# Patient Record
Sex: Female | Born: 1950 | Race: White | Hispanic: No | Marital: Married | Smoking: Never smoker
Health system: Southern US, Community
[De-identification: ages and names within clinical notes are randomized; demographics above are authoritative.]

## PROBLEM LIST (undated history)

## (undated) DIAGNOSIS — C801 Malignant (primary) neoplasm, unspecified: Secondary | ICD-10-CM

## (undated) DIAGNOSIS — M1990 Unspecified osteoarthritis, unspecified site: Secondary | ICD-10-CM

## (undated) DIAGNOSIS — H269 Unspecified cataract: Secondary | ICD-10-CM

## (undated) DIAGNOSIS — D649 Anemia, unspecified: Secondary | ICD-10-CM

## (undated) DIAGNOSIS — T7840XA Allergy, unspecified, initial encounter: Secondary | ICD-10-CM

## (undated) HISTORY — DX: Anemia, unspecified: D64.9

## (undated) HISTORY — DX: Unspecified osteoarthritis, unspecified site: M19.90

## (undated) HISTORY — PX: NM RENAL LASIX (ARMC HX): HXRAD1213

## (undated) HISTORY — PX: COLONOSCOPY: SHX174

## (undated) HISTORY — DX: Allergy, unspecified, initial encounter: T78.40XA

## (undated) HISTORY — DX: Unspecified cataract: H26.9

## (undated) HISTORY — PX: HEMORRHOID SURGERY: SHX153

## (undated) HISTORY — DX: Malignant (primary) neoplasm, unspecified: C80.1

## (undated) HISTORY — PX: OTHER SURGICAL HISTORY: SHX169

---

## 1994-03-21 HISTORY — PX: ABDOMINAL HYSTERECTOMY: SHX81

## 2014-04-09 ENCOUNTER — Encounter: Payer: Self-pay | Admitting: Podiatrist

## 2014-04-09 ENCOUNTER — Ambulatory Visit (INDEPENDENT_AMBULATORY_CARE_PROVIDER_SITE_OTHER): Payer: BLUE CROSS/BLUE SHIELD | Admitting: Podiatrist

## 2014-04-09 VITALS — BP 101/67 | HR 66 | Resp 12

## 2014-04-09 DIAGNOSIS — M6740 Ganglion, unspecified site: Secondary | ICD-10-CM

## 2014-04-09 NOTE — Progress Notes (Signed)
   Subjective:    Patient ID: Nicole Medina, female    DOB: 07-Jan-1951, 64 y.o.   MRN: [id number]  HPI PT STATED RT FOOT HAVE A KNOT AND THE TOES AND ARCH BEEN PAINFUL FOR 6 MONTHS. THE FOOT IS GETTING WORSE AND ESPECIALLY FIRST THING IN THE MORNING. TRIED NO TREATMENT.   Review of Systems  All other systems reviewed and are negative.      Objective:   Physical Exam Patient is awake, alert, and oriented x 3.  In no acute distress.  Vascular status is intact with palpable pedal pulses at 2/4 DP and PT bilateral and capillary refill time within normal limits. Neurological sensation is also intact bilaterally via Semmes Weinstein monofilament at 5/5 sites. Light touch, vibratory sensation, Achilles tendon reflex is intact. Dermatological exam reveals skin color, turger and texture as normal. No open lesions present.  Musculature intact with dorsiflexion, plantarflexion, inversion, eversion.  Soft fluctuance on the dorsum of the foot at the medial/intermediate cuneiform/navicular is noted with compression.  It does appear to be a ganglion based on its presentation in this area. Mild tenderness with palpation identified.    xrays taken and reveal slight soft tissue swelling dorsally but no discreet fluid collection is identified on xray.      Assessment & Plan:  Ganglion cyst- dorsum of left foot (slowly growing)-- painful  Plan:  Recommended injection of steroid into the cyst as it is not tense or well identified enough to drain.  Patient agreed and this was carried out today under sterile technique with 5 mg of kenalog, 2 mg of dexamethasone and 0.5% marcaine plain.  Dr. Genene Churn tolerated this well.  We will watch the area for the next 2 weeks and if there is no improvement, will consder an mri for delineation of the cyst and for possible surgical excision.

## 2014-04-09 NOTE — Patient Instructions (Signed)

## 2014-09-03 ENCOUNTER — Ambulatory Visit: Payer: BLUE CROSS/BLUE SHIELD | Admitting: Podiatry

## 2014-09-24 ENCOUNTER — Ambulatory Visit (INDEPENDENT_AMBULATORY_CARE_PROVIDER_SITE_OTHER): Payer: BLUE CROSS/BLUE SHIELD

## 2014-09-24 ENCOUNTER — Ambulatory Visit (INDEPENDENT_AMBULATORY_CARE_PROVIDER_SITE_OTHER): Payer: BLUE CROSS/BLUE SHIELD | Admitting: Podiatry

## 2014-09-24 ENCOUNTER — Encounter: Payer: Self-pay | Admitting: Podiatry

## 2014-09-24 VITALS — BP 110/69 | HR 59 | Resp 12

## 2014-09-24 DIAGNOSIS — M6740 Ganglion, unspecified site: Secondary | ICD-10-CM

## 2014-09-24 NOTE — Progress Notes (Signed)
   Subjective:    Patient ID: Nicole Medina, female    DOB: 01-21-1951, 64 y.o.   MRN: [id number]  HPI  This patient presents today complaining of pain in the dorsal left midfoot with increasing pain the last 4 weeks. The symptoms occur on weightbearing and physical activity such as treadmill and are relieved with rest. She has less discomfort when she wears in athletic style shoe. She has approximately 13 year history of pain in the dorsal left foot with generalized increasing pain in the past year. She describes having a Kenalog dexamethasone injection by Dr. Kevan Ny  January 2016 with 3 weeks of relief from the injection. She describes a soft tissue mass on the dorsum of the left foot, however, she says that the swelling has reduced but still persisted. Discomfort in the dorsal acid left foot reduces some of her ability to do her physical activity. Patient also describes treatment by Dr. Blanch Media a podiatrist who recommended observation of the area dorsal aspect left foot. She also relates a history of having custom foot orthotics that she has, however, has not worn recently. She describes the orthotics as rigid She denies any other joint pain     Review of Systems  All other systems reviewed and are negative.      Objective:   Physical Exam  Orientated 3  Vascular: No peripheral edema noted bilaterally DP and PT pulses 2/4 bilaterally Capillary reflex immediate bilaterally  Dermatological: Texture and turgor. Good  Neurological: Ankle reflexes reactive bilaterally  Musculoskeletal:  palpation the dorsal left midfoot causes discomfort in or around the base of the first metatarsocuneiform area without any obvious discrete palpable lesions. There may be a slight edema over this area, however, there is no history no obvious organized soft tissue masses in the area. HAV deformity left Patient appears to have a stable gait without limping     X-ray weightbearing left foot with  metallic circular wire placed on the dorsal midfoot marking the symptomatic area  Intact bony structure without fracture and/or dislocation HAV deformity Inferior calcaneal spur The circular metallic wire overlies the base of first second metatarsal cuneiform area without any bony abnormalities noted There appears to be no increased soft tissue density underlying the metallic marker Bone density appears adequate Joint spaces appear adequate  Radiographic impression: No acute bony abnormality noted in the left foot          Assessment & Plan:   Assessment: History a ganglionic cyst dorsal aspect of left foot Possible chronic midfoot pain associated with weightbearing and overactivity Possible stress fracture  Plan: I reviewed the results of examination and x-ray with patient today. I made aware that I could not give her her a definitive diagnosis at this time. I recommended that she wear her existing rigid foot orthotics to see if the pain in her dorsal midfoot reduced with additional support. If the pain did not reduce or change with extra-support I recommended that she return for follow-up examination with Dr. Earleen Newport . I also made aware that if she return more definitive imaging such as MRI would be indicated  Reappoint at patient's request

## 2014-09-24 NOTE — Patient Instructions (Signed)
Where your existing rigid foot orthotic to see if your left midfoot pain reduces. If the midfoot pain left persists without improvement contact our office to schedule point with Dr. Mayo Ao for further evaluation and possible MRI imaging versus CT scanning

## 2018-12-07 ENCOUNTER — Ambulatory Visit (INDEPENDENT_AMBULATORY_CARE_PROVIDER_SITE_OTHER): Payer: Medicare Other

## 2018-12-07 ENCOUNTER — Ambulatory Visit (INDEPENDENT_AMBULATORY_CARE_PROVIDER_SITE_OTHER): Payer: Medicare Other | Admitting: Podiatry

## 2018-12-07 ENCOUNTER — Other Ambulatory Visit: Payer: Self-pay

## 2018-12-07 ENCOUNTER — Encounter: Payer: Self-pay | Admitting: Podiatry

## 2018-12-07 VITALS — BP 119/72 | HR 57 | Resp 16

## 2018-12-07 DIAGNOSIS — M67472 Ganglion, left ankle and foot: Secondary | ICD-10-CM

## 2018-12-07 DIAGNOSIS — M779 Enthesopathy, unspecified: Secondary | ICD-10-CM | POA: Diagnosis not present

## 2018-12-07 DIAGNOSIS — M792 Neuralgia and neuritis, unspecified: Secondary | ICD-10-CM

## 2018-12-07 NOTE — Patient Instructions (Signed)
You can apply voltaren gel to the area daily   Ganglion Cyst  A ganglion cyst is a non-cancerous, fluid-filled lump that occurs near a joint or tendon. The cyst grows out of a joint or the lining of a tendon. Ganglion cysts most often develop in the hand or wrist, but they can also develop in the shoulder, elbow, hip, knee, ankle, or foot. Ganglion cysts are ball-shaped or egg-shaped. Their size can range from the size of a pea to larger than a grape. Increased activity may cause the cyst to get bigger because more fluid starts to build up. What are the causes? The exact cause of this condition is not known, but it may be related to:  Inflammation or irritation around the joint.  An injury.  Repetitive movements or overuse.  Arthritis. What increases the risk? You are more likely to develop this condition if:  You are a woman.  You are 68-21 years old. What are the signs or symptoms? The main symptom of this condition is a lump. It most often appears on the hand or wrist. In many cases, there are no other symptoms, but a cyst can sometimes cause:  Tingling.  Pain.  Numbness.  Muscle weakness.  Weak grip.  Less range of motion in a joint. How is this diagnosed? Ganglion cysts are usually diagnosed based on a physical exam. Your health care provider will feel the lump and may shine a light next to it. If it is a ganglion cyst, the light will likely shine through it. Your health care provider may order an X-ray, ultrasound, or MRI to rule out other conditions. How is this treated? Ganglion cysts often go away on their own without treatment. If you have pain or other symptoms, treatment may be needed. Treatment is also needed if the ganglion cyst limits your movement or if it gets infected. Treatment may include:  Wearing a brace or splint on your wrist or finger.  Taking anti-inflammatory medicine.  Having fluid drained from the lump with a needle (aspiration).  Getting  a steroid injected into the joint.  Having surgery to remove the ganglion cyst.  Placing a pad on your shoe or wearing shoes that will not rub against the cyst if it is on your foot. Follow these instructions at home:  Do not press on the ganglion cyst, poke it with a needle, or hit it.  Take over-the-counter and prescription medicines only as told by your health care provider.  If you have a brace or splint: ? Wear it as told by your health care provider. ? Remove it as told by your health care provider. Ask if you need to remove it when you take a shower or a bath.  Watch your ganglion cyst for any changes.  Keep all follow-up visits as told by your health care provider. This is important. Contact a health care provider if:  Your ganglion cyst becomes larger or more painful.  You have pus coming from the lump.  You have weakness or numbness in the affected area.  You have a fever or chills. Get help right away if:  You have a fever and have any of these in the cyst area: ? Increased redness. ? Red streaks. ? Swelling. Summary  A ganglion cyst is a non-cancerous, fluid-filled lump that occurs near a joint or tendon.  Ganglion cysts most often develop in the hand or wrist, but they can also develop in the shoulder, elbow, hip, knee, ankle, or foot.  Ganglion cysts often go away on their own without treatment. This information is not intended to replace advice given to you by your health care provider. Make sure you discuss any questions you have with your health care provider. Document Released: 03/04/2000 Document Revised: 02/17/2017 Document Reviewed: 11/04/2016 Elsevier Patient Education  2020 [employer].

## 2018-12-19 NOTE — Progress Notes (Signed)
Subjective:   Patient ID: Nicole Medina, female   DOB: 68 y.o.   MRN: [id number]   HPI 68 year old female presents the office with concerns of dorsal midfoot pain, cyst.  She had this previously thousand 16 and she was referred to me at that point but she was doing well so she did not come and however she states that this started to recur and is actually become larger.  Area is painful with pressure in shoes over the last several months she started to get numbness in the second and third toes as well.  No recent injury.   Review of Systems  All other systems reviewed and are negative.  No past medical history on file.  No past surgical history on file.   Current Outpatient Medications:  .  CLIMARA 0.06 MG/24HR, See admin instructions., Disp: , Rfl: 12  No Known Allergies     Objective:  Physical Exam  General: AAO x3, NAD  Dermatological: Skin is warm, dry and supple bilateral. Nails x 10 are well manicured; remaining integument appears unremarkable at this time. There are no open sores, no preulcerative lesions, no rash or signs of infection present.  Vascular: Dorsalis Pedis artery and Posterior Tibial artery pedal pulses are 2/4 bilateral with immedate capillary fill time. Pedal hair growth present. No varicosities and no lower extremity edema present bilateral. There is no pain with calf compression, swelling, warmth, erythema.   Neruologic: Grossly intact via light touch bilateral.  Protective threshold with Semmes Wienstein monofilament intact to all pedal sites bilateral.   Musculoskeletal: On the dorsal aspect of the left midfoot there is a small mobile soft tissue mass consistent with a ganglion cyst.  There is been more of an exostosis underneath this which is causing some discomfort as well but are able to palpate.  No tenderness to the area.  There is no erythema.  Muscular strength 5/5 in all groups tested bilateral.  Gait: Unassisted, Nonantalgic.       Assessment:    Likely ganglion cyst/bone spur left foot causing neuritis    Plan:  -Treatment options discussed including all alternatives, risks, and complications -Etiology of symptoms were discussed -X-rays were obtained and reviewed.  No evidence of acute fracture or calcifications. -We discussed with conservative as well as surgical options.  Discussed steroid injection today into the soft tissue mass.  Recommend offloading.  Voltaren gel. She wants to hold off on any surgery for now  Return in about 2 months (around 02/06/2019).  Trula Slade DPM

## 2019-01-04 ENCOUNTER — Encounter: Payer: Self-pay | Admitting: Internal Medicine

## 2019-01-29 ENCOUNTER — Other Ambulatory Visit: Payer: Self-pay

## 2019-01-29 ENCOUNTER — Ambulatory Visit (AMBULATORY_SURGERY_CENTER): Payer: Medicare Other | Admitting: *Deleted

## 2019-01-29 VITALS — Temp 97.5°F | Ht 65.0 in | Wt 141.0 lb

## 2019-01-29 DIAGNOSIS — Z1159 Encounter for screening for other viral diseases: Secondary | ICD-10-CM

## 2019-01-29 DIAGNOSIS — Z1211 Encounter for screening for malignant neoplasm of colon: Secondary | ICD-10-CM

## 2019-01-29 MED ORDER — SUPREP BOWEL PREP KIT 17.5-3.13-1.6 GM/177ML PO SOLN: 1.0000 | Freq: Once | ORAL | 0 refills | Status: AC

## 2019-01-29 NOTE — Progress Notes (Signed)
No egg or soy allergy known to patient  No issues with past sedation with any surgeries  or procedures, no intubation problems  No diet pills per patient No home 02 use per patient  No blood thinners per patient  Pt denies issues with constipation  No A fib or A flutter  EMMI video sent to pt's e mail   COV test 11-17 at 1115 am   Due to the COVID-19 pandemic we are asking patients to follow these guidelines. Please only bring one care partner. Please be aware that your care partner may wait in the car in the parking lot or if they feel like they will be too hot to wait in the car, they may wait in the lobby on the 4th floor. All care partners are required to wear a mask the entire time (we do not have any that we can provide them), they need to practice social distancing, and we will do a Covid check for all patient's and care partners when you arrive. Also we will check their temperature and your temperature. If the care partner waits in their car they need to stay in the parking lot the entire time and we will call them on their cell phone when the patient is ready for discharge so they can bring the car to the front of the building. Also all patient's will need to wear a mask into building.

## 2019-02-05 ENCOUNTER — Other Ambulatory Visit: Payer: Self-pay

## 2019-02-05 ENCOUNTER — Encounter: Payer: Medicare Other | Admitting: Podiatry

## 2019-02-05 ENCOUNTER — Other Ambulatory Visit: Payer: Self-pay | Admitting: Internal Medicine

## 2019-02-05 ENCOUNTER — Ambulatory Visit (INDEPENDENT_AMBULATORY_CARE_PROVIDER_SITE_OTHER): Payer: Medicare Other

## 2019-02-05 DIAGNOSIS — Z1159 Encounter for screening for other viral diseases: Secondary | ICD-10-CM

## 2019-02-06 LAB — SARS CORONAVIRUS 2 (TAT 6-24 HRS): SARS Coronavirus 2: NEGATIVE

## 2019-02-08 ENCOUNTER — Ambulatory Visit (AMBULATORY_SURGERY_CENTER): Payer: Medicare Other | Admitting: Internal Medicine

## 2019-02-08 ENCOUNTER — Encounter: Payer: Self-pay | Admitting: Internal Medicine

## 2019-02-08 ENCOUNTER — Other Ambulatory Visit: Payer: Self-pay

## 2019-02-08 VITALS — BP 105/57 | HR 64 | Temp 97.9°F | Resp 14 | Ht 65.0 in | Wt 141.0 lb

## 2019-02-08 DIAGNOSIS — Z1211 Encounter for screening for malignant neoplasm of colon: Secondary | ICD-10-CM | POA: Diagnosis present

## 2019-02-08 DIAGNOSIS — K635 Polyp of colon: Secondary | ICD-10-CM | POA: Diagnosis not present

## 2019-02-08 DIAGNOSIS — D122 Benign neoplasm of ascending colon: Secondary | ICD-10-CM

## 2019-02-08 MED ORDER — SODIUM CHLORIDE 0.9 % IV SOLN: 500.0000 mL | Freq: Once | INTRAVENOUS | Status: DC

## 2019-02-08 NOTE — Progress Notes (Signed)
Temperature- June Bullock VS- Courtney Washington  Pt's states no medical or surgical changes since previsit or office visit.  

## 2019-02-08 NOTE — Patient Instructions (Signed)

## 2019-02-08 NOTE — Progress Notes (Signed)
Called to room to assist during endoscopic procedure.  Patient ID and intended procedure confirmed with present staff. Received instructions for my participation in the procedure from the performing physician.  

## 2019-02-08 NOTE — Progress Notes (Signed)
Report given to PACU, vss 

## 2019-02-08 NOTE — Op Note (Signed)
[place] Patient Name: Nicole Medina Procedure Date: 02/08/2019 1:49 PM MRN: [id number] Endoscopist: Jerene Bears , MD Age: 68 Referring MD:  Date of Birth: 01/31/1951 Gender: Female Account #: [id number] Procedure:                Colonoscopy Indications:              Screening for colorectal malignant neoplasm, Last                            colonoscopy 10 years ago Medicines:                Monitored Anesthesia Care Procedure:                Pre-Anesthesia Assessment:                           - Prior to the procedure, a History and Physical                            was performed, and patient medications and                            allergies were reviewed. The patient's tolerance of                            previous anesthesia was also reviewed. The risks                            and benefits of the procedure and the sedation                            options and risks were discussed with the patient.                            All questions were answered, and informed consent                            was obtained. Prior Anticoagulants: The patient has                            taken no previous anticoagulant or antiplatelet                            agents. ASA Grade Assessment: II - A patient with                            mild systemic disease. After reviewing the risks                            and benefits, the patient was deemed in                            satisfactory condition to undergo the procedure.  After obtaining informed consent, the colonoscope                            was passed under direct vision. Throughout the                            procedure, the patient's blood pressure, pulse, and                            oxygen saturations were monitored continuously. The                            Colonoscope was introduced through the anus and                            advanced to the colocolonic  anastomosis. The                            colonoscopy was performed without difficulty. The                            patient tolerated the procedure well. The quality                            of the bowel preparation was good. The ileocecal                            valve, appendiceal orifice, and rectum were                            photographed. Scope In: 1:56:07 PM Scope Out: 2:11:33 PM Scope Withdrawal Time: 0 hours 9 minutes 36 seconds  Total Procedure Duration: 0 hours 15 minutes 26 seconds  Findings:                 The digital rectal exam findings include mild anal                            stenosis.                           A 2 mm polyp was found in the ascending colon. The                            polyp was sessile. The polyp was removed with a                            cold biopsy forceps. Resection and retrieval were                            complete.                           Scattered small-mouthed diverticula were found in  the sigmoid colon, descending colon and ascending                            colon.                           Mild melanosis was found in the entire colon.                           There was evidence of a prior surgical intervention                            in the distal rectum, query prior hemorrhoidectomy.                            Retroflexed views in the rectum otherwise normal. Complications:            No immediate complications. Estimated Blood Loss:     Estimated blood loss was minimal. Impression:               - Mild anal stenosis found on digital rectal exam.                           - One 2 mm polyp in the ascending colon, removed                            with a cold biopsy forceps. Resected and retrieved.                           - Diverticulosis in the sigmoid colon, in the                            descending colon and in the ascending colon.                           - Melanosis in the  colon. Recommendation:           - Patient has a contact number available for                            emergencies. The signs and symptoms of potential                            delayed complications were discussed with the                            patient. Return to normal activities tomorrow.                            Written discharge instructions were provided to the                            patient.                           -  Resume previous diet.                           - Continue present medications.                           - Await pathology results.                           - Repeat colonoscopy is recommended. The                            colonoscopy date will be determined after pathology                            results from today's exam become available for                            review. Jerene Bears, MD 02/08/2019 2:17:42 PM This report has been signed electronically.

## 2019-02-10 NOTE — Progress Notes (Signed)
I was running behind schedule and apparently the patient left without being seen.

## 2019-02-12 ENCOUNTER — Telehealth: Payer: Self-pay

## 2019-02-12 NOTE — Telephone Encounter (Signed)
  Follow up Call-  Call back number 02/08/2019  Post procedure Call Back phone  # [phone]  Permission to leave phone message Yes  Some recent data might be hidden     Patient questions:  Do you have a fever, pain , or abdominal swelling? No. Pain Score  0 *  Have you tolerated food without any problems? Yes.    Have you been able to return to your normal activities? Yes.    Do you have any questions about your discharge instructions: Diet   No. Medications  No. Follow up visit  No.  Do you have questions or concerns about your Care? No.  Actions: * If pain score is 4 or above: No action needed, pain <4. 1. Have you developed a fever since your procedure? no  2.   Have you had an respiratory symptoms (SOB or cough) since your procedure? no  3.   Have you tested positive for COVID 19 since your procedure no  4.   Have you had any family members/close contacts diagnosed with the COVID 19 since your procedure?  no   If yes to any of these questions please route to Joylene John, RN and Alphonsa Gin, Therapist, sports.

## 2019-02-18 ENCOUNTER — Encounter: Payer: Self-pay | Admitting: Internal Medicine

## 2019-02-21 ENCOUNTER — Encounter: Payer: Self-pay | Admitting: Podiatry

## 2019-08-05 ENCOUNTER — Other Ambulatory Visit: Payer: Self-pay

## 2019-08-05 ENCOUNTER — Ambulatory Visit (INDEPENDENT_AMBULATORY_CARE_PROVIDER_SITE_OTHER): Payer: Medicare Other | Admitting: Podiatry

## 2019-08-05 DIAGNOSIS — M779 Enthesopathy, unspecified: Secondary | ICD-10-CM

## 2019-08-05 DIAGNOSIS — M67472 Ganglion, left ankle and foot: Secondary | ICD-10-CM

## 2019-08-12 NOTE — Progress Notes (Signed)
Subjective: 69 year old female presents the office today requesting an injection of the top of her left foot.  I previously saw her December 09, 2027 for ganglion cyst and perform an injection she did well.  She is getting ready to go on vacation and she is having some discomfort she is requesting another injection.  The neuritis symptoms she had last appointment resolved after the injection.  No recent changes or injury.  Denies any systemic complaints such as fevers, chills, nausea, vomiting. No acute changes since last appointment, and no other complaints at this time.   Objective: AAO x3, NAD DP/PT pulses palpable bilaterally, CRT less than 3 seconds Dorsal callus off the left midfoot.  Small likely cyst is present but more exostosis is palpable today.  There is no edema, erythema. No pain with calf compression, swelling, warmth, erythema  Assessment: Ganglion cyst/bone spur left foot  Plan: -All treatment options discussed with the patient including all alternatives, risks, complications.  -Skin clean with alcohol and mixture of 1 cc Kenalog 10, 0.5 cc of Marcaine plain, 0.5 cc of lidocaine plain was infiltrated into the area of maximal tenderness/cyst without any complications.  She tolerated procedure well.  Post procedure instructions discussed. -Patient encouraged to call the office with any questions, concerns, change in symptoms.   Trula Slade DPM

## 2019-08-20 DIAGNOSIS — M1711 Unilateral primary osteoarthritis, right knee: Secondary | ICD-10-CM | POA: Insufficient documentation

## 2019-10-10 DIAGNOSIS — M25561 Pain in right knee: Secondary | ICD-10-CM | POA: Insufficient documentation

## 2020-01-23 DIAGNOSIS — M79601 Pain in right arm: Secondary | ICD-10-CM | POA: Insufficient documentation

## 2020-03-31 ENCOUNTER — Ambulatory Visit: Payer: Medicare Other | Admitting: Podiatry

## 2020-03-31 DIAGNOSIS — M19029 Primary osteoarthritis, unspecified elbow: Secondary | ICD-10-CM | POA: Insufficient documentation

## 2020-06-11 ENCOUNTER — Ambulatory Visit (INDEPENDENT_AMBULATORY_CARE_PROVIDER_SITE_OTHER): Payer: Medicare Other

## 2020-06-11 ENCOUNTER — Ambulatory Visit (INDEPENDENT_AMBULATORY_CARE_PROVIDER_SITE_OTHER): Payer: Medicare Other | Admitting: Podiatry

## 2020-06-11 ENCOUNTER — Other Ambulatory Visit: Payer: Self-pay

## 2020-06-11 DIAGNOSIS — M779 Enthesopathy, unspecified: Secondary | ICD-10-CM

## 2020-06-11 DIAGNOSIS — M67472 Ganglion, left ankle and foot: Secondary | ICD-10-CM

## 2020-06-11 MED ORDER — TRIAMCINOLONE ACETONIDE 10 MG/ML IJ SUSP
10.0000 mg | Freq: Once | INTRAMUSCULAR | Status: AC
  Administered 2020-06-11: 10 mg

## 2020-06-14 NOTE — Progress Notes (Signed)
Subjective: 70 year old female presents the office today for evaluation of recurrent cyst in the dorsal aspect the left foot.  She states that she had an appointment previously but the lesion resolved on its own but it started to come back after being active on her feet.  No recent injury or trauma.  Area is uncomfortable with pressure even with certain shoes. Denies any systemic complaints such as fevers, chills, nausea, vomiting. No acute changes since last appointment, and no other complaints at this time.   Objective: AAO x3, NAD DP/PT pulses palpable bilaterally, CRT less than 3 seconds Dorsal exostosis of the left midfoot.  Small likely cyst is present but more exostosis is palpable today.  There is no edema, erythema.  Flexor, extensor tendons appear to be intact.  MMT 5/5. No pain with calf compression, swelling, warmth, erythema  Assessment: Ganglion cyst/bone spur left foot  Plan: -All treatment options discussed with the patient including all alternatives, risks, complications.  -Skin clean with alcohol and mixture of 1 cc Kenalog 10, 0.5 cc of Marcaine plain, 0.5 cc of lidocaine plain was infiltrated into the area of maximal tenderness/cyst without any complications.  She tolerated procedure well.  Post procedure instructions discussed.  Discussed surgical intervention if needed. -Patient encouraged to call the office with any questions, concerns, change in symptoms.   Trula Slade DPM

## 2020-11-13 ENCOUNTER — Encounter: Payer: Self-pay | Admitting: Physician Assistant

## 2020-11-13 ENCOUNTER — Ambulatory Visit (INDEPENDENT_AMBULATORY_CARE_PROVIDER_SITE_OTHER): Payer: Medicare Other | Admitting: Physician Assistant

## 2020-11-13 ENCOUNTER — Other Ambulatory Visit (INDEPENDENT_AMBULATORY_CARE_PROVIDER_SITE_OTHER): Payer: Medicare Other

## 2020-11-13 VITALS — BP 110/70 | HR 72 | Ht 63.0 in | Wt 147.2 lb

## 2020-11-13 DIAGNOSIS — R194 Change in bowel habit: Secondary | ICD-10-CM

## 2020-11-13 DIAGNOSIS — R140 Abdominal distension (gaseous): Secondary | ICD-10-CM

## 2020-11-13 DIAGNOSIS — R935 Abnormal findings on diagnostic imaging of other abdominal regions, including retroperitoneum: Secondary | ICD-10-CM

## 2020-11-13 DIAGNOSIS — K5900 Constipation, unspecified: Secondary | ICD-10-CM

## 2020-11-13 LAB — COMPREHENSIVE METABOLIC PANEL
ALT: 12 U/L (ref 0–35)
AST: 20 U/L (ref 0–37)
Albumin: 4.2 g/dL (ref 3.5–5.2)
Alkaline Phosphatase: 76 U/L (ref 39–117)
BUN: 11 mg/dL (ref 6–23)
CO2: 27 mEq/L (ref 19–32)
Calcium: 9.6 mg/dL (ref 8.4–10.5)
Chloride: 103 mEq/L (ref 96–112)
Creatinine, Ser: 0.77 mg/dL (ref 0.40–1.20)
GFR: 78.36 mL/min (ref >60.00)
Glucose, Bld: 86 mg/dL (ref 70–99)
Potassium: 4.2 mEq/L (ref 3.5–5.1)
Sodium: 140 mEq/L (ref 135–145)
Total Bilirubin: 0.7 mg/dL (ref 0.2–1.2)
Total Protein: 7.2 g/dL (ref 6.0–8.3)

## 2020-11-13 LAB — CBC WITH DIFFERENTIAL/PLATELET
Basophils Absolute: 0 10*3/uL (ref 0.0–0.1)
Basophils Relative: 0.7 % (ref 0.0–3.0)
Eosinophils Absolute: 0.1 10*3/uL (ref 0.0–0.7)
Eosinophils Relative: 1.7 % (ref 0.0–5.0)
HCT: 44.8 % (ref 36.0–46.0)
Hemoglobin: 15 g/dL (ref 12.0–15.0)
Lymphocytes Relative: 30.8 % (ref 12.0–46.0)
Lymphs Abs: 1.5 10*3/uL (ref 0.7–4.0)
MCHC: 33.6 g/dL (ref 30.0–36.0)
MCV: 98.2 fl (ref 78.0–100.0)
Monocytes Absolute: 0.4 10*3/uL (ref 0.1–1.0)
Monocytes Relative: 7.8 % (ref 3.0–12.0)
Neutro Abs: 2.9 10*3/uL (ref 1.4–7.7)
Neutrophils Relative %: 59 % (ref 43.0–77.0)
Platelets: 250 10*3/uL (ref 150.0–400.0)
RBC: 4.56 Mil/uL (ref 3.87–5.11)
RDW: 14.1 % (ref 11.5–15.5)
WBC: 4.9 10*3/uL (ref 4.0–10.5)

## 2020-11-13 MED ORDER — LINACLOTIDE 145 MCG PO CAPS: 145.0000 ug | ORAL_CAPSULE | Freq: Every day | ORAL | 3 refills | Status: DC

## 2020-11-13 MED ORDER — LUBIPROSTONE 8 MCG PO CAPS: 8.0000 ug | ORAL_CAPSULE | Freq: Two times a day (BID) | ORAL | 5 refills | Status: DC

## 2020-11-13 MED ORDER — RIFAXIMIN 550 MG PO TABS: 550.0000 mg | ORAL_TABLET | Freq: Three times a day (TID) | ORAL | 0 refills | Status: DC

## 2020-11-13 MED ORDER — RIFAXIMIN 550 MG PO TABS: 550.0000 mg | ORAL_TABLET | Freq: Three times a day (TID) | ORAL | 0 refills | Status: AC

## 2020-11-13 NOTE — Progress Notes (Signed)
Chief Complaint: Bloating, constipation and gas  HPI:    Nicole Medina is a 70 year old female with a past medical history as listed below, known to Dr. Hilarie Fredrickson, who presents clinic today for a complaint of bloating, constipation and gas.    08/16/2011 ultrasound of the abdomen complete with a cyst in the liver, 2 echogenic foci adjacent which may represent hemangiomas however they are nonspecific by ultrasound, relatively unremarkable otherwise    02/08/2019 colonoscopy with mild anal stenosis found on digital rectal exam, 1 to millimeter polyp in the ascending colon and diverticulosis in the sigmoid, descending and ascending colon as well as melanosis.  Pathology with benign lymphoid aggregate.  Repeat recommended 10 years.    07/01/2020 BMP normal, CBC normal    10/13/2020 CT of the abdomen pelvis without contrast with colonic diverticulosis without diverticulitis, scattered indeterminate hepatic hypodense lesions, consider MRI abdomen with contrast to further evaluate, left ovarian cyst, large stool throughout the right colon.    Today, the patient describes that she has chronic constipation for which she typically uses an over-the-counter Dulcolax with senna in the evenings.  This typically allows her to have a bowel movement the next day though it varies in amount and consistency and she never feels completely empty.  Most recently the second week of July she started to become distended in her abdomen anytime she would eat or drink anything.  Does tell me that 2 days prior to onset of symptoms she had a "emotional upset", but since then everything has been rectified and she continues with symptoms.  Tells me she has also started with problems defecating and urinating which she thinks are related to the amount of gas in her abdomen.  Denies any pain associated with this other than some occasional right lower quadrant tenderness when this first started.  Did take laxatives in excess one day which seemed to  help a little bit when she was able to produce more stool.  Also discusses some soreness in her perineal area from time to time.  Does tell me that she took Ciprofloxacin for 3 days to treat for possible SIBO but it did not seem to be making a difference so she stopped it.    Husband is a cardiologist.    Denies fever, chills, abdominal pain, rectal bleeding, heartburn, reflux, nausea or vomiting.  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     Calcium Carbonate (CALCIUM 600 PO) Take by mouth.     CLIMARA 0.06 MG/24HR See admin instructions.  12   diclofenac Sodium (VOLTAREN) 1 % GEL Apply topically as needed.     No current facility-administered medications for this visit.    Allergies as of 11/13/2020   (No Known Allergies)    Family History  Problem Relation Age of Onset   Colon cancer Maternal  Aunt    Colon polyps Neg Hx    Esophageal cancer Neg Hx    Rectal cancer Neg Hx    Stomach cancer Neg Hx     Social History   Socioeconomic History   Marital status: Married    Spouse name: Not on file   Number of children: Not on file   Years of education: Not on file   Highest education level: Not on file  Occupational History   Not on file  Tobacco Use   Smoking status: Never   Smokeless tobacco: Never  Vaping Use   Vaping Use: Never used  Substance and Sexual Activity   Alcohol use: Yes    Comment: wine in the evenings    Drug use: No   Sexual activity: Not on file  Other Topics Concern    Not on file  Social History Narrative   Not on file   Social Determinants of Health   Financial Resource Strain: Not on file  Food Insecurity: Not on file  Transportation Needs: Not on file  Physical Activity: Not on file  Stress: Not on file  Social Connections: Not on file  Intimate Partner Violence: Not on file    Review of Systems:    Constitutional: No weight loss, fever or chills Skin: No rash  Cardiovascular: No chest pain Respiratory: No SOB  Gastrointestinal: See HPI and otherwise negative Genitourinary: No dysuria  Neurological: No headache, dizziness or syncope Musculoskeletal: No new muscle or joint pain Hematologic: No bleeding  Psychiatric: No history of depression or anxiety   Physical Exam:  Vital signs: BP 110/70 (BP Location: Left Arm, Patient Position: Sitting, Cuff Size: Normal)   Pulse 72   Ht '5\' 3"'$  (1.6 m) Comment: height measured without shoes  Wt 147 lb 4 oz (66.8 kg)   BMI 26.08 kg/m    Constitutional:   Pleasant Caucasian female appears to be in NAD, Well developed, Well nourished, alert and cooperative Head:  Normocephalic and atraumatic. Eyes:   PEERL, EOMI. No icterus. Conjunctiva pink. Ears:  Normal auditory acuity. Neck:  Supple Throat: Oral cavity and pharynx without inflammation, swelling or lesion.  Respiratory: Respirations even and unlabored. Lungs clear to auscultation bilaterally.   No wheezes, crackles, or rhonchi.  Cardiovascular: Normal S1, S2. No MRG. Regular rate and rhythm. No peripheral edema, cyanosis or pallor.  Gastrointestinal:  Soft, moderate distension, nontender. No rebound or guarding.  Decreased bowel sounds all 4 quadrants. No appreciable masses or hepatomegaly. Rectal:  Not performed.  Msk:  Symmetrical without gross deformities. Without edema, no deformity or joint abnormality.  Neurologic:  Alert and  oriented x4;  grossly normal neurologically.  Skin:   Dry and intact without significant lesions or  rashes. Psychiatric: Demonstrates good judgement and reason without abnormal affect or behaviors.  See HPI for recent labs and imaging  Assessment: 1.  Increased abdominal distention: Since the second week in July, CT AP without contrast unrevealing, some relief with excess laxatives, colonoscopy 02/08/2019 with some anal stenosis and diverticulosis; consider relation to constipation+/- SIBO 2.  Constipation: Could be related to anal stenosis seen at time of colonoscopy, worsened now with above 3.  Abnormal imaging of the liver: Showing a cyst and 2 likely hemangiomas, MRI recommended  Plan: 1.  Discussed testing for SIBO with a breath test versus treating empirically.  Patient would like to be treated empirically.  Started Xifaxan 550 mg p.o. 3 times daily x14 days.  #42 2.  Discussed that this  could be related to bacterial overgrowth and constipation.  Would recommend we change her laxative regimen.  Prescribed Linzess 145 mcg daily, 30-60 minutes before breakfast in the morning.  She will likely not need her over-the-counter laxative with this.  Prescribed #30 with 3 refills. 3.  Ordered MRI with and without contrast of the abdomen and pelvis given abnormal CT of the liver and change in bowel habits with abdominal distention 4.  Ordered CBC and CMP 5.  Patient to follow in clinic per recommendations after imaging/labs above.  Ellouise Newer, PA-C [facility] 11/13/2020, 10:10 AM  Cc: No ref. provider found

## 2020-11-13 NOTE — Telephone Encounter (Signed)
Please advise 

## 2020-11-13 NOTE — Patient Instructions (Signed)
Your provider has requested that you go to the basement level for lab work before leaving today. Press "B" on the elevator. The lab is located at the first door on the left as you exit the elevator.  We have sent the following medications to your pharmacy for you to pick up at your convenience: Linzess 145 mcg once daily 30-60 minutes before breakfast. Xifaxan three times daily for 2 weeks.   You have been scheduled for an MRI at [facility] (1st floor radiology) on Wednesday 11/25/20. Your appointment time is 12 pm. Please arrive to admitting (at main entrance of the hospital) 15 minutes prior to your appointment time for registration purposes. Please make certain not to have anything to eat or drink 6 hours prior to your test. In addition, if you have any metal in your body, have a pacemaker or defibrillator, please be sure to let your ordering physician know. This test typically takes 45 minutes to 1 hour to complete. Should you need to reschedule, please call [phone] to do so.  If you are age 67 or older, your body mass index should be between 23-30. Your Body mass index is 26.08 kg/m. If this is out of the aforementioned range listed, please consider follow up with your Primary Care Provider.  If you are age 41 or younger, your body mass index should be between 19-25. Your Body mass index is 26.08 kg/m. If this is out of the aformentioned range listed, please consider follow up with your Primary Care Provider.   __________________________________________________________  The [facility] providers would like to encourage you to use [facility] to communicate with providers for non-urgent requests or questions.  Due to long hold times on the telephone, sending your provider a message by [facility] may be a faster and more efficient way to get a response.  Please allow 48 business hours for a response.  Please remember that this is for non-urgent requests.

## 2020-11-16 NOTE — Progress Notes (Signed)
Addendum: Reviewed and agree with assessment and management plan. Abdalrahman Clementson M, MD  

## 2020-11-21 ENCOUNTER — Ambulatory Visit (HOSPITAL_COMMUNITY)
Admission: RE | Admit: 2020-11-21 | Discharge: 2020-11-21 | Disposition: A | Discharge status: Home / Self Care | Payer: Medicare Other | Source: Ambulatory Visit | Attending: Physician Assistant | Admitting: Physician Assistant

## 2020-11-21 ENCOUNTER — Other Ambulatory Visit: Payer: Self-pay

## 2020-11-21 DIAGNOSIS — R140 Abdominal distension (gaseous): Secondary | ICD-10-CM

## 2020-11-21 DIAGNOSIS — R935 Abnormal findings on diagnostic imaging of other abdominal regions, including retroperitoneum: Secondary | ICD-10-CM

## 2020-11-21 DIAGNOSIS — R194 Change in bowel habit: Secondary | ICD-10-CM

## 2020-11-21 MED ORDER — GADOBUTROL 1 MMOL/ML IV SOLN
6.5000 mL | Freq: Once | INTRAVENOUS | Status: AC | PRN
  Administered 2020-11-21: 6.5 mL via INTRAVENOUS

## 2020-11-25 ENCOUNTER — Ambulatory Visit (HOSPITAL_COMMUNITY): Admission: RE | Admit: 2020-11-25 | Payer: Medicare Other | Source: Ambulatory Visit

## 2020-11-25 ENCOUNTER — Ambulatory Visit (HOSPITAL_COMMUNITY): Payer: Medicare Other

## 2020-12-24 ENCOUNTER — Ambulatory Visit (INDEPENDENT_AMBULATORY_CARE_PROVIDER_SITE_OTHER): Payer: Medicare Other | Admitting: Podiatry

## 2020-12-24 ENCOUNTER — Other Ambulatory Visit: Payer: Self-pay

## 2020-12-24 DIAGNOSIS — M779 Enthesopathy, unspecified: Secondary | ICD-10-CM

## 2020-12-24 DIAGNOSIS — M67472 Ganglion, left ankle and foot: Secondary | ICD-10-CM

## 2020-12-24 NOTE — Patient Instructions (Signed)

## 2020-12-30 NOTE — Progress Notes (Signed)
Subjective: 70 year old female presents the office today for evaluation of recurrent cyst in the dorsal aspect the left foot.  She is requesting a steroid injection.  No recent injury or trauma.  Area is tender with pressure and with rubbing.  No other concerns.    Objective: AAO x3, NAD DP/PT pulses palpable bilaterally, CRT less than 3 seconds Dorsal exostosis of the left midfoot.  Cyst is present today but more exostosis is palpable today.  There is no edema, erythema.  Flexor, extensor tendons appear to be intact.  MMT 5/5. No pain with calf compression, swelling, warmth, erythema  Assessment: Ganglion cyst/bone spur left foot  Plan: -All treatment options discussed with the patient including all alternatives, risks, complications.  -Skin clean with alcohol and mixture of 1 cc Kenalog 10, 0.5 cc of Marcaine plain, 0.5 cc of lidocaine plain was infiltrated into the area of maximal tenderness/cyst without any complications.  She tolerated procedure well.  Post procedure instructions discussed.  Discussed surgical intervention if needed. -Patient encouraged to call the office with any questions, concerns, change in symptoms.   Trula Slade DPM

## 2021-05-06 ENCOUNTER — Encounter: Payer: Self-pay | Admitting: Podiatry

## 2021-05-06 NOTE — Telephone Encounter (Signed)
Please advise 

## 2021-05-10 ENCOUNTER — Other Ambulatory Visit: Payer: Self-pay

## 2021-05-10 ENCOUNTER — Ambulatory Visit (INDEPENDENT_AMBULATORY_CARE_PROVIDER_SITE_OTHER): Payer: Medicare Other | Admitting: Podiatry

## 2021-05-10 DIAGNOSIS — M778 Other enthesopathies, not elsewhere classified: Secondary | ICD-10-CM | POA: Diagnosis not present

## 2021-05-10 DIAGNOSIS — M779 Enthesopathy, unspecified: Secondary | ICD-10-CM

## 2021-05-10 MED ORDER — TRIAMCINOLONE ACETONIDE 10 MG/ML IJ SUSP
10.0000 mg | Freq: Once | INTRAMUSCULAR | Status: AC
Start: 2021-05-10 — End: 2021-05-10
  Administered 2021-05-10: 10 mg

## 2021-05-10 NOTE — Patient Instructions (Signed)

## 2021-05-12 NOTE — Progress Notes (Signed)
Subjective: 71 year old female presents the office today for evaluation of recurrent cyst in the dorsal aspect the left foot.  She is requesting a steroid injection.  No recent changes otherwise. Objective: AAO x3, NAD DP/PT pulses palpable bilaterally, CRT less than 3 seconds Dorsal exostosis of the left midfoot.  Small fluid-filled cyst is present today but more exostosis is palpable today.  There is no edema, erythema.  Flexor, extensor tendons appear to be intact.  MMT 5/5. No pain with calf compression, swelling, warmth, erythema  Assessment: Ganglion cyst/bone spur left foot  Plan: -All treatment options discussed with the patient including all alternatives, risks, complications.  -Discussed risks of repeated injection including tendon rupture.  Skin clean with alcohol and mixture of 1 cc Kenalog 10, 0.5 cc of Marcaine plain, 0.5 cc of lidocaine plain was infiltrated into the area of maximal tenderness/cyst without any complications.  She tolerated procedure well.  Post procedure instructions discussed.  Discussed surgical intervention if needed again today to remove the spur, cyst.  We discussed the surgery as well as postoperative course. -Patient encouraged to call the office with any questions, concerns, change in symptoms.   Trula Slade DPM

## 2021-05-25 ENCOUNTER — Telehealth: Payer: Self-pay

## 2021-05-25 DIAGNOSIS — R935 Abnormal findings on diagnostic imaging of other abdominal regions, including retroperitoneum: Secondary | ICD-10-CM

## 2021-05-25 DIAGNOSIS — K769 Liver disease, unspecified: Secondary | ICD-10-CM

## 2021-05-25 NOTE — Telephone Encounter (Signed)
-----   Message from Yevette Edwards, RN sent at 11/25/2020 12:51 PM EDT ----- ?Regarding: Hepatic protocol MRI ?Hepatic protocol MRI with and without contrast (MR Abdomen and Pelvis)- hepatic lesions ? ?

## 2021-05-25 NOTE — Telephone Encounter (Signed)
Spoke with patient to remind her that she is due for repeat MRI at this time. Patient is aware that it was recommended that she repeat MRI 6 months from the last time to follow up on the hepatic lesions. Pt is aware that I will place the orders and radiology scheduling will be contact her within the next week or so to set up her appt. Patient verbalized understanding and had no concerns at the end of the call.  ? ?MRI orders in epic. Secure staff message sent to radiology scheduling to set up pt's appt.  ?

## 2021-06-11 ENCOUNTER — Ambulatory Visit (HOSPITAL_COMMUNITY)
Admission: RE | Admit: 2021-06-11 | Discharge: 2021-06-11 | Disposition: A | Discharge status: Home / Self Care | Payer: Medicare Other | Source: Ambulatory Visit | Attending: Physician Assistant | Admitting: Physician Assistant

## 2021-06-11 ENCOUNTER — Other Ambulatory Visit: Payer: Self-pay

## 2021-06-11 DIAGNOSIS — R935 Abnormal findings on diagnostic imaging of other abdominal regions, including retroperitoneum: Secondary | ICD-10-CM

## 2021-06-11 DIAGNOSIS — K769 Liver disease, unspecified: Secondary | ICD-10-CM | POA: Diagnosis present

## 2021-06-11 MED ORDER — GADOBUTROL 1 MMOL/ML IV SOLN
7.0000 mL | Freq: Once | INTRAVENOUS | Status: AC | PRN
  Administered 2021-06-11: 7 mL via INTRAVENOUS

## 2021-06-11 NOTE — Progress Notes (Signed)
Spoke with Rad Entrikin on MRI orders for ABD and Pelvis today. Rad states only MR ABD clinically needed for today.  ?

## 2021-11-09 ENCOUNTER — Ambulatory Visit (INDEPENDENT_AMBULATORY_CARE_PROVIDER_SITE_OTHER): Payer: Medicare Other | Admitting: Podiatry

## 2021-11-09 DIAGNOSIS — M778 Other enthesopathies, not elsewhere classified: Secondary | ICD-10-CM

## 2021-11-09 MED ORDER — TRIAMCINOLONE ACETONIDE 10 MG/ML IJ SUSP
10.0000 mg | Freq: Once | INTRAMUSCULAR | Status: AC
  Administered 2021-11-09: 10 mg

## 2021-11-16 NOTE — Progress Notes (Signed)
Subjective: 71 year old female presents the office today for evaluation of recurrent cyst in the dorsal aspect the left foot.  States that she has been doing well she has been very active but the pain is starting to come back requesting another steroid injection today.  No recent injury or changes otherwise.    Objective: AAO x3, NAD DP/PT pulses palpable bilaterally, CRT less than 3 seconds Dorsal exostosis of the left midfoot.  Small fluid-filled cyst is present today but more exostosis is palpable today.  Flexor, extensor tendons appear to be intact.  There is no edema, erythema.  Flexor, extensor tendons appear to be intact.  MMT 5/5. No pain with calf compression, swelling, warmth, erythema  Assessment: Ganglion cyst/bone spur left foot  Plan: -All treatment options discussed with the patient including all alternatives, risks, complications.  -Discussed risks of repeated injection including tendon rupture.  Skin clean with alcohol and mixture of 1 cc Kenalog 10, 0.5 cc of Marcaine plain, 0.5 cc of lidocaine plain was infiltrated into the area of maximal tenderness/cyst without any complications on the midfoot joint.  She tolerated procedure well.  Post procedure instructions discussed.  Discussed surgical intervention if needed again today to remove the spur, cyst.  We discussed the surgery as well as postoperative course. -Recommend holding off on activity for next couple days gently injection side effects. -Patient encouraged to call the office with any questions, concerns, change in symptoms.   Trula Slade DPM

## 2022-05-19 ENCOUNTER — Ambulatory Visit (INDEPENDENT_AMBULATORY_CARE_PROVIDER_SITE_OTHER): Payer: Medicare Other | Admitting: Podiatry

## 2022-05-19 ENCOUNTER — Ambulatory Visit (INDEPENDENT_AMBULATORY_CARE_PROVIDER_SITE_OTHER): Payer: Medicare Other

## 2022-05-19 DIAGNOSIS — M778 Other enthesopathies, not elsewhere classified: Secondary | ICD-10-CM

## 2022-05-19 DIAGNOSIS — M67472 Ganglion, left ankle and foot: Secondary | ICD-10-CM | POA: Diagnosis not present

## 2022-05-19 DIAGNOSIS — M779 Enthesopathy, unspecified: Secondary | ICD-10-CM

## 2022-05-19 MED ORDER — TRIAMCINOLONE ACETONIDE 10 MG/ML IJ SUSP
10.0000 mg | Freq: Once | INTRAMUSCULAR | Status: AC
  Administered 2022-05-19: 10 mg

## 2022-05-19 NOTE — Patient Instructions (Signed)

## 2022-05-19 NOTE — Progress Notes (Signed)
Subjective: Chief Complaint  Patient presents with   Foot Problem    Chronic pain, edema left anterior foot over ganglion. Patient is interested in getting an injection if possible. Also would like to know what other options are there to help the cyst.     72 year old female presents the office today for evaluation of recurrent cyst in the dorsal aspect the left foot.  She states that there is status cause discomfort with activities and she wants to consider options and suggesting her options for the pain/cyst.  No recent injuries.  She is requesting steroid injection today.  Objective: AAO x3, NAD DP/PT pulses palpable bilaterally, CRT less than 3 seconds Dorsal exostosis of the left midfoot.  This does appear to be somewhat larger today than what it has been previously.  Small fluid-filled cyst is present today but more exostosis is palpable today.  Flexor, extensor tendons appear to be intact.  There is no edema, erythema.  Flexor, extensor tendons appear to be intact.  MMT 5/5. No pain with calf compression, swelling, warmth, erythema  Assessment: Ganglion cyst/bone spur left foot  Plan: -All treatment options discussed with the patient including all alternatives, risks, complications.  -Repeat x-rays obtained reviewed with the patient.  Multiple views were obtained.  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  -We discussed both conservative as well as surgical treatment options.  After discussion she is in continue with conservative care.  She was to proceed with injection today and she is well aware of the risks of injection including tendon rupture as well as other risks of repeat steroid injections. -Discussed risks of repeated injection including tendon rupture.  Skin clean with alcohol and mixture of 1 cc Kenalog 10, 0.5 cc of Marcaine plain, 0.5 cc of lidocaine plain was infiltrated into the area of maximal tenderness/cyst without any complications on the midfoot joint.   She tolerated procedure well.  Post procedure instructions discussed.  Discussed surgical intervention if needed again today to remove the spur, cyst.   -Recommend holding off on activity for next couple days gently injection side effects. -Patient encouraged to call the office with any questions, concerns, change in symptoms.   Trula Slade DPM

## 2022-08-03 ENCOUNTER — Encounter: Payer: Self-pay | Admitting: Podiatry

## 2022-08-22 ENCOUNTER — Ambulatory Visit (INDEPENDENT_AMBULATORY_CARE_PROVIDER_SITE_OTHER): Payer: Medicare Other | Admitting: Podiatry

## 2022-08-22 ENCOUNTER — Encounter: Payer: Self-pay | Admitting: Podiatry

## 2022-08-22 DIAGNOSIS — M67472 Ganglion, left ankle and foot: Secondary | ICD-10-CM

## 2022-08-22 DIAGNOSIS — M778 Other enthesopathies, not elsewhere classified: Secondary | ICD-10-CM | POA: Diagnosis not present

## 2022-08-22 MED ORDER — TRIAMCINOLONE ACETONIDE 10 MG/ML IJ SUSP
10.0000 mg | Freq: Once | INTRAMUSCULAR | Status: AC
  Administered 2022-08-22: 10 mg

## 2022-08-22 NOTE — Patient Instructions (Signed)

## 2022-08-24 NOTE — Progress Notes (Signed)
Subjective: Chief Complaint  Patient presents with   Injections    Pt stated that she is doing well she just wanted to get an injection     72 year old female presents the office today for evaluation of recurrent cyst in the dorsal aspect the left foot, bone spur.  He states the injection did help last appointment but did not last as long.  Requesting another injection.  Objective: AAO x3, NAD DP/PT pulses palpable bilaterally, CRT less than 3 seconds Dorsal exostosis of the left midfoot.  Tenderness palpation sharply along this area.  There is no significant cyst formation but there is trace edema there is no erythema or warmth.  Clinically the extensor tendons appear to be intact.  MMT 5/5. No pain with calf compression, swelling, warmth, erythema  Assessment: Ganglion cyst/bone spur left foot  Plan: -All treatment options discussed with the patient including all alternatives, risks, complications.  -We discussed both conservative as well as surgical treatment options.  After discussion she is in continue with conservative care but eventually may need to have surgery.  She was to proceed with injection. We discussed risks of repeated injection (including tendon rupture/tear)  -Skin clean with alcohol and mixture of 1 cc Kenalog 10, 0.5 cc of Marcaine plain, 0.5 cc of lidocaine plain was infiltrated into the area of maximal tenderness/cyst without any complications on the midfoot joint.  She tolerated procedure well.  Post procedure instructions discussed.  Discussed surgical intervention if needed again today to remove the spur, cyst.   -Recommend holding off on activity for next couple days gently injection side effects. -Patient encouraged to call the office with any questions, concerns, change in symptoms.   Vivi Barrack DPM

## 2022-12-08 ENCOUNTER — Ambulatory Visit (INDEPENDENT_AMBULATORY_CARE_PROVIDER_SITE_OTHER): Payer: Medicare Other

## 2022-12-08 ENCOUNTER — Ambulatory Visit (INDEPENDENT_AMBULATORY_CARE_PROVIDER_SITE_OTHER): Payer: Medicare Other | Admitting: Podiatry

## 2022-12-08 DIAGNOSIS — M778 Other enthesopathies, not elsewhere classified: Secondary | ICD-10-CM | POA: Diagnosis not present

## 2022-12-08 NOTE — Progress Notes (Signed)
Subjective: Chief Complaint  Patient presents with   Foot Pain    (Rm 14) almost contant pain left foot-  pain started about 3 weeks ago.after she attended an [place] wedding in [place] and did a lot of dancing in dress shoes. Pain has been constant since and is the worse it has ever been.   Gait is off, can't walk can't exercise.  Pain and inflammation on the dorsal midfoot.  Aleve helps, topicals help. Injections ususally last 4 or so months. Last injection given 08/22/22    72 year old female presents the office today for evaluation of recurrent cyst in the dorsal aspect the left foot, bone spur.  States that she is on her feet and very active recently and since then she has had pain.  She states the same pain as previously but more intense.  No injuries that she reports.    Objective: AAO x3, NAD DP/PT pulses palpable bilaterally, CRT less than 3 seconds Dorsal exostosis of the left midfoot.  There is localized edema to the area.  Flexor, extensor tendons.  Intact.  Tenderness palpation sharply along this area.  There is no significant cyst formation but there is trace edema there is no erythema or warmth.  Clinically the extensor tendons appear to be intact.  MMT 5/5. No pain with calf compression, swelling, warmth, erythema  Assessment: Ganglion cyst/bone spur left foot  Plan: -All treatment options discussed with the patient including all alternatives, risks, complications.  -Repeat x-rays obtained reviewed.  3 views of the foot were obtained.  Arthritic changes present at the Lisfranc joint.  No evidence of acute fracture or stress fracture noted. -This is a conservative still surgical options.  She is not quite ready to proceed with certification and requesting an injection.  She is well aware of the risks including tendon rupture. -Skin clean with alcohol and mixture of 1 cc Kenalog 10, 0.5 cc of Marcaine plain, 0.5 cc of lidocaine plain was infiltrated into the area of maximal  tenderness/cyst without any complications on the midfoot joint.  She tolerated procedure well.  Post procedure instructions discussed.  Discussed surgical intervention if needed again today to remove the spur, cyst.   -Recommend holding off on activity for next couple days gently injection side effects. -Patient encouraged to call the office with any questions, concerns, change in symptoms.   Vivi Barrack DPM

## 2022-12-15 ENCOUNTER — Encounter: Payer: Self-pay | Admitting: Podiatry

## 2023-01-23 ENCOUNTER — Ambulatory Visit: Payer: Medicare Other | Admitting: Podiatry

## 2023-02-06 ENCOUNTER — Ambulatory Visit (INDEPENDENT_AMBULATORY_CARE_PROVIDER_SITE_OTHER): Payer: Medicare Other | Admitting: Podiatry

## 2023-02-06 DIAGNOSIS — M778 Other enthesopathies, not elsewhere classified: Secondary | ICD-10-CM | POA: Diagnosis not present

## 2023-02-06 NOTE — Patient Instructions (Signed)

## 2023-02-06 NOTE — Progress Notes (Signed)
Subjective: No chief complaint on file.   72 year old female presents the office today for evaluation of recurrent cyst in the dorsal aspect the left foot, bone spur.  She states it was really inflamed last week. She recently just got back from a [place] cruise and is getting ready to be in [place] for a month. No injuries or new concerns.    Objective: AAO x3, NAD DP/PT pulses palpable bilaterally, CRT less than 3 seconds Dorsal exostosis of the left midfoot.  There is localized edema to the area. Tenderness to palpation directly to this area.  Flexor, extensor tendons.  Intact.  Tenderness palpation sharply along this area.  Clinically the extensor tendons appear to be intact.  MMT 5/5. No pain with calf compression, swelling, warmth, erythema  Assessment: Ganglion cyst/bone spur left foot  Plan: -All treatment options discussed with the patient including all alternatives, risks, complications.  -This is a conservative still surgical options.  She is not quite ready to proceed with certification and requesting an injection.  She is well aware of the risks including tendon rupture. -Patient is well aware of risks of repeat injection. Skin clean with alcohol and mixture of 1 cc Kenalog 10, 0.5 cc of Marcaine plain, 0.5 cc of lidocaine plain was infiltrated into the area of maximal tenderness/cyst without any complications on the midfoot joint.  She tolerated procedure well.  Post procedure instructions discussed.  Discussed surgical intervention if needed again today to remove the spur, cyst.   -Recommend holding off on activity for next couple days gently injection side effects. -Patient encouraged to call the office with any questions, concerns, change in symptoms.   Vivi Barrack DPM

## 2023-05-14 ENCOUNTER — Encounter: Payer: Self-pay | Admitting: Podiatry

## 2023-06-08 ENCOUNTER — Encounter: Payer: Self-pay | Admitting: Podiatry

## 2023-06-08 ENCOUNTER — Ambulatory Visit (INDEPENDENT_AMBULATORY_CARE_PROVIDER_SITE_OTHER): Payer: Medicare Other | Admitting: Podiatry

## 2023-06-08 DIAGNOSIS — M778 Other enthesopathies, not elsewhere classified: Secondary | ICD-10-CM | POA: Diagnosis not present

## 2023-06-08 NOTE — Progress Notes (Signed)
 Subjective: Chief Complaint  Patient presents with   Foot Pain    Rm#12 Left foot pain started up 1 month ago very painful would like injection that she get's relief from the injections.     73 year old female presents the office today for evaluation of recurrent pain on the dorsal aspect the left foot, bone spur.  She states the injections are helpful but over the last month her pain started coming back.  No recent injury or changes.    Objective: AAO x3, NAD DP/PT pulses palpable bilaterally, CRT less than 3 seconds Dorsal exostosis of the left midfoot.  There is localized edema to the area. Tenderness to palpation directly to this area.  Flexor, extensor tendons intact.  Tenderness palpation sharply along this area.  Clinically the extensor tendons appear to be intact.  MMT 5/5. No pain with calf compression, swelling, warmth, erythema  Assessment: Ganglion cyst/bone spur left foot  Plan: -All treatment options discussed with the patient including all alternatives, risks, complications.  -We have previously discussed both conservative and surgical options.  She is not quite ready to proceed with certification and requesting an injection.  She is well aware of the risks including tendon rupture. -Patient is well aware of risks of repeat injection. Skin clean with alcohol and mixture of 1 cc Kenalog 10, 0.5 cc of Marcaine plain, 0.5 cc of lidocaine plain was infiltrated into the area of maximal tenderness/cyst without any complications on the midfoot joint.  She tolerated procedure well.  Post procedure instructions discussed.   -Recommend holding off on activity for next couple days gently injection side effects. -Patient encouraged to call the office with any questions, concerns, change in symptoms.   Nicole Medina DPM

## 2023-06-08 NOTE — Patient Instructions (Signed)

## 2023-06-26 IMAGING — MR MR ABDOMEN WO/W CM
18 series · 48 of 48 positions shown · IV contrast (gadavist)
Comparison: MRI abdomen and pelvis dated November 21, 2020

CLINICAL DATA: Follow-up indeterminate segment II cystic lesion

EXAM:
MRI ABDOMEN WITHOUT AND WITH CONTRAST
TECHNIQUE: Multiplanar multisequence MR imaging of the abdomen was performed
both before and after the administration of intravenous contrast.
CONTRAST:  7mL GADAVIST GADOBUTROL 1 MMOL/ML IV SOLN

[Series 3: T2 · coronal · 6.0mm · 1.56mm/px · 2 of 30 slices shown (1 of 2)]
[im 1/30]
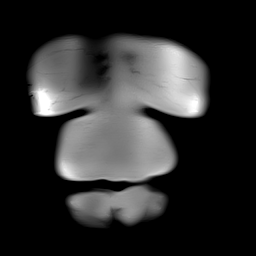
[im 30/30]
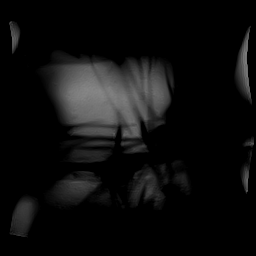

[Series 4: T2 fat-sat · axial · 6.0mm · 1.25mm/px · z∈[-182,+121]mm · 3 of 43 slices shown]
[im 1/43]
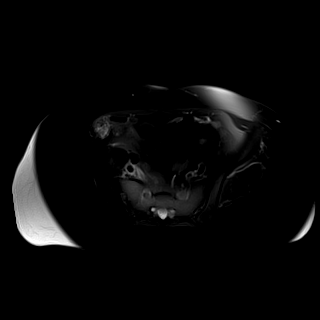
[im 22/43]
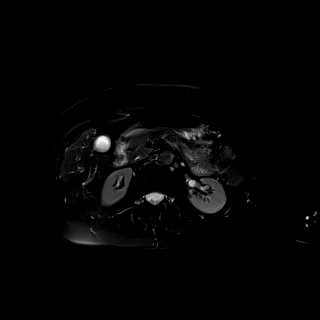
[im 43/43]
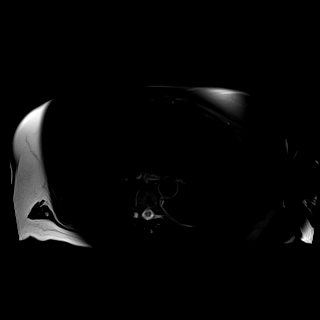

[Series 6: DWI · axial · 6.0mm · 1.49mm/px · z∈[-197,+113]mm · 3 of 88 slices shown (1 of 2)]
[im 1/88]
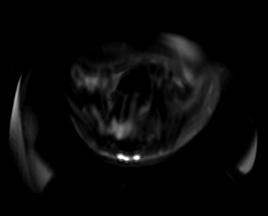
[im 44/88]
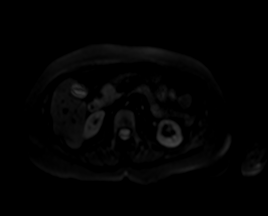
[im 88/88]
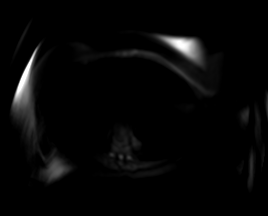

[Series 7: DWI · axial · 6.0mm · 1.49mm/px · z∈[-197,+113]mm · 2 of 44 slices shown (2 of 2)]
[im 1/44]
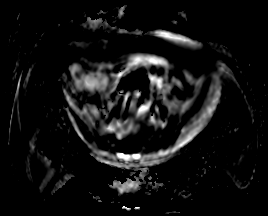
[im 44/44]
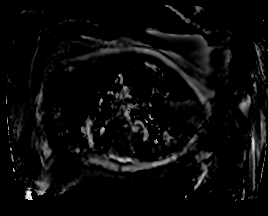

[Series 8: T1 · axial · 3.0mm · 1.25mm/px · z∈[-187,+74]mm · 3 of 88 slices shown (1 of 2)]
[im 1/88]
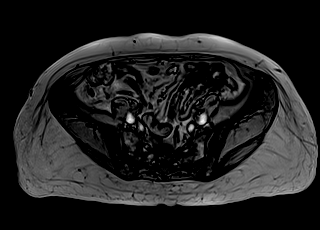
[im 44/88]
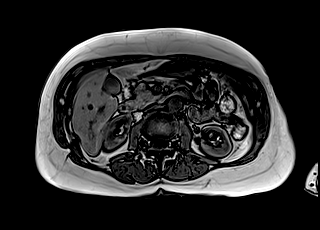
[im 88/88]
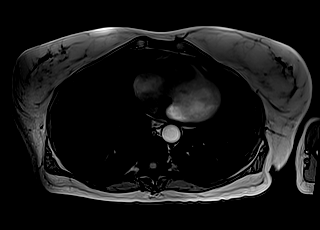

[Series 9: T1 · axial · 3.0mm · 1.25mm/px · z∈[-187,+74]mm · 3 of 88 slices shown (2 of 2)]
[im 1/88]
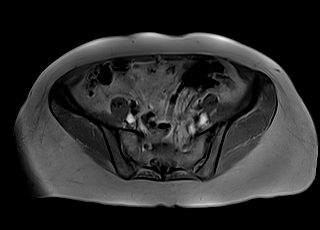
[im 44/88]
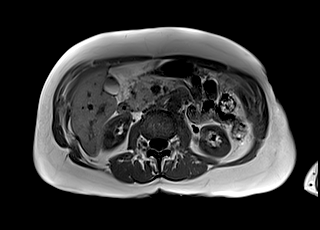
[im 88/88]
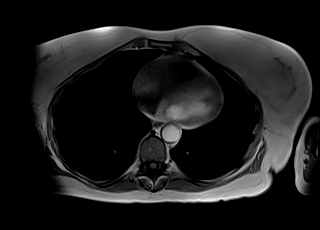

[Series 10: bSSFP · axial · 4.0mm · 0.84mm/px · z∈[-190,+82]mm · 2 of 69 slices shown]
[im 1/69]
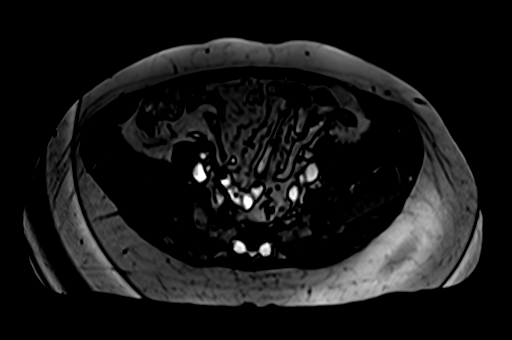
[im 69/69]
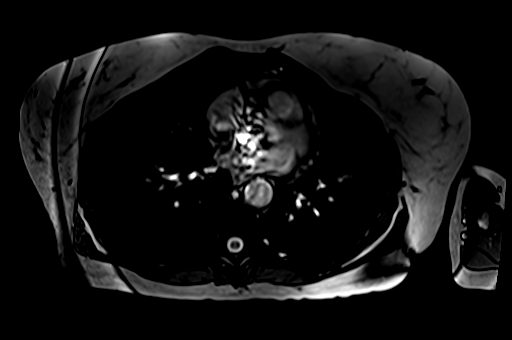

[Series 12: T1 dynamic · axial · 3.0mm · 1.25mm/px · z∈[-187,+74]mm · 3 of 88 slices shown (1 of 10)]
[im 1/88]
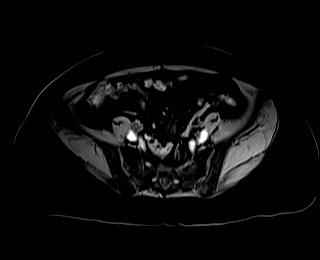
[im 44/88]
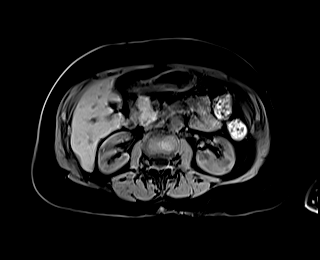
[im 88/88]
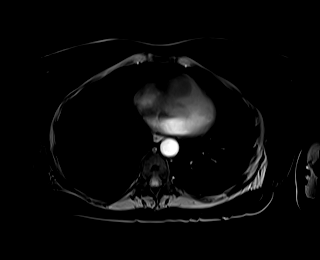

[Series 16: T1 dynamic · axial · 3.0mm · 1.25mm/px · z∈[-187,+74]mm · 3 of 88 slices shown (2 of 10)]
[im 1/88]
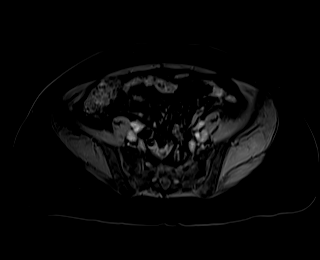
[im 44/88]
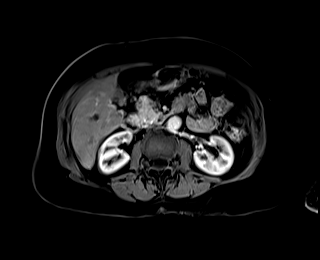
[im 88/88]
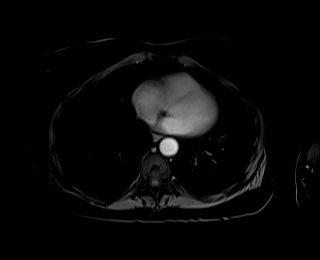

[Series 17: T1 dynamic · axial · 3.0mm · 1.25mm/px · z∈[-187,+74]mm · 3 of 88 slices shown (3 of 10)]
[im 1/88]
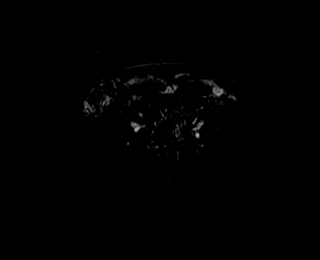
[im 44/88]
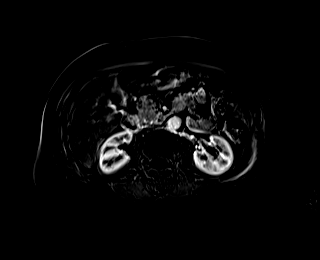
[im 88/88]
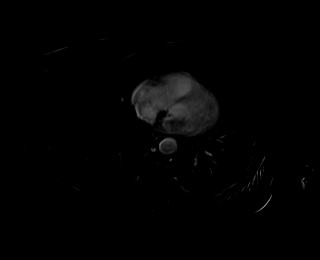

[Series 20: T1 dynamic · axial · 3.0mm · 1.25mm/px · z∈[-187,+74]mm · 3 of 88 slices shown (4 of 10)]
[im 1/88]
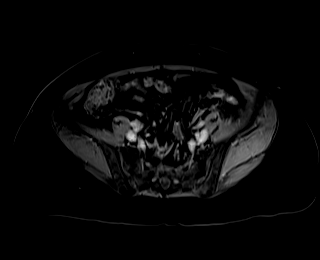
[im 44/88]
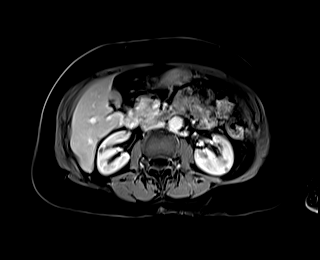
[im 88/88]
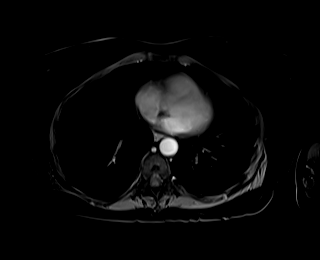

[Series 21: T1 dynamic · axial · 3.0mm · 1.25mm/px · z∈[-187,+74]mm · 3 of 88 slices shown (5 of 10)]
[im 1/88]
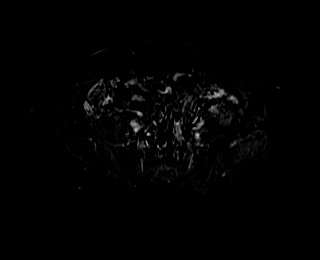
[im 44/88]
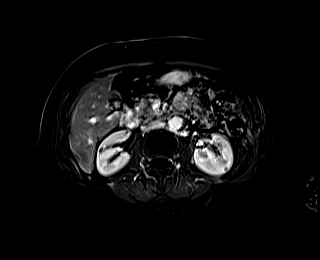
[im 88/88]
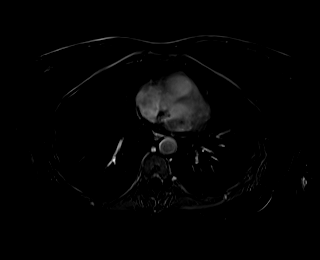

[Series 24: T1 dynamic · axial · 3.0mm · 1.25mm/px · z∈[-187,+74]mm · 3 of 88 slices shown (6 of 10)]
[im 1/88]
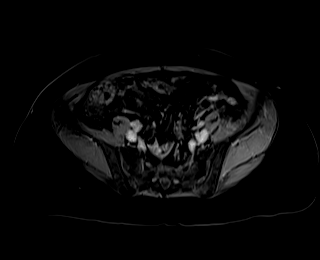
[im 44/88]
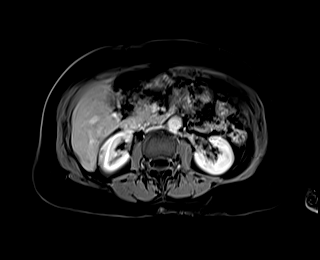
[im 88/88]
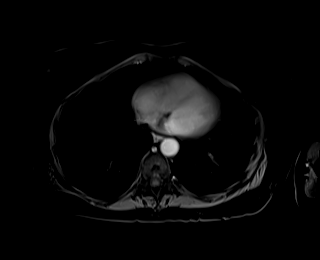

[Series 25: T1 dynamic · axial · 3.0mm · 1.25mm/px · z∈[-187,+74]mm · 3 of 88 slices shown (7 of 10)]
[im 1/88]
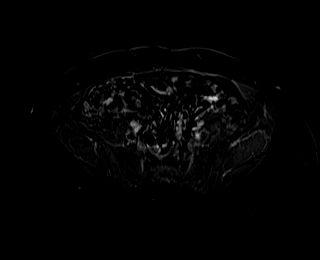
[im 44/88]
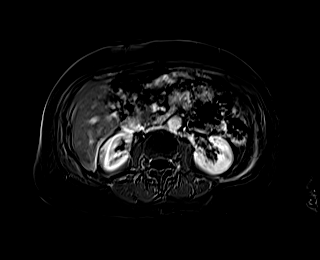
[im 88/88]
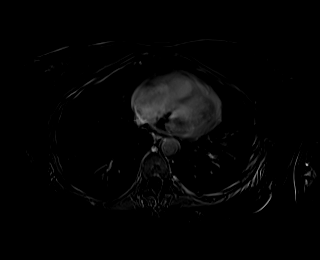

[Series 27: T1 dynamic · coronal · 5.0mm · 1.41mm/px · 2 of 48 slices shown (8 of 10)]
[im 1/48]
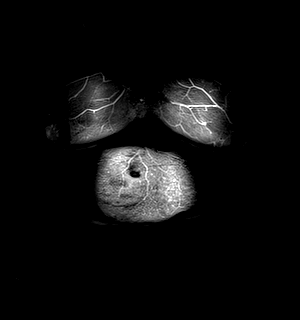
[im 48/48]
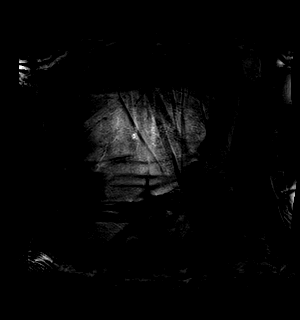

[Series 28: T2 · axial · 6.0mm · 1.56mm/px · 1 of 40 slices shown (2 of 2)]
[im 1/40]
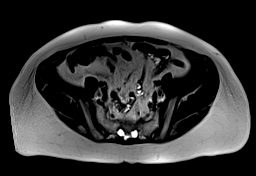

[Series 31: T1 dynamic · axial · 3.0mm · 1.25mm/px · z∈[-187,+74]mm · 3 of 88 slices shown (9 of 10)]
[im 1/88]
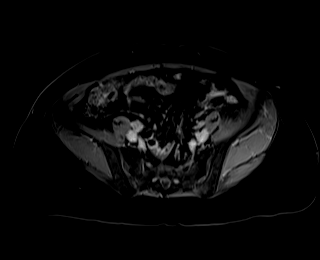
[im 44/88]
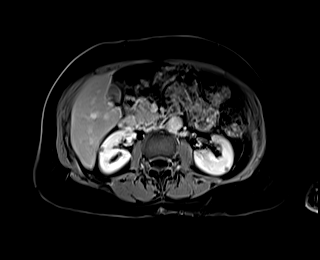
[im 88/88]
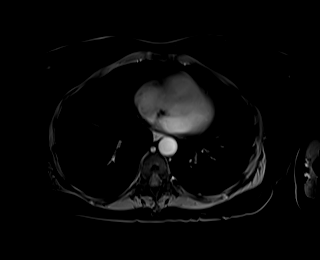

[Series 32: T1 dynamic · axial · 3.0mm · 1.25mm/px · z∈[-187,+74]mm · 3 of 88 slices shown (10 of 10)]
[im 1/88]
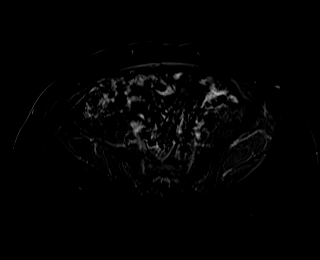
[im 44/88]
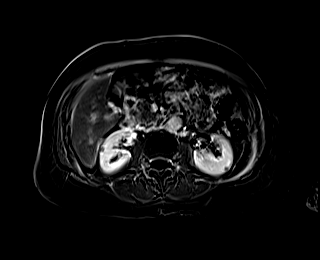
[im 88/88]
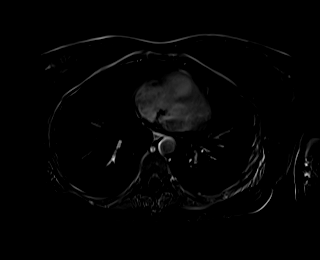

[48 of 48 positions shown; findings below may reference images not displayed]

FINDINGS: Lower chest: No acute findings.

Hepatobiliary: Segment 2 cystic lesion with minimally thick smooth
wall and internal septation measures 2.3 x 2.1 cm on series 4, image
14, unchanged in size when compared with prior exam and remeasured
in similar plane, no evidence of enhancement on postcontrast images.
Additional smaller T2 hyperintense lesions are seen no evidence of
contrast enhancement, compatible simple cysts. Gallbladder is
unremarkable. No biliary ductal dilation.

Pancreas: No mass, inflammatory changes, or other parenchymal
abnormality identified.

Spleen:  Within normal limits in size and appearance.

Adrenals/Urinary Tract: No masses identified. No evidence of
hydronephrosis.

Stomach/Bowel: Diverticulosis. Visualized portions within the
abdomen are otherwise unremarkable.

Vascular/Lymphatic: No pathologically enlarged lymph nodes
identified. No abdominal aortic aneurysm demonstrated.

Other:  None.

Musculoskeletal: No suspicious bone lesions identified.
IMPRESSION: Segment II cystic lesion is unchanged in size and appearance when
compared prior exam, likely a benign complex hepatic cyst.

## 2023-09-02 ENCOUNTER — Encounter: Payer: Self-pay | Admitting: Podiatry

## 2023-09-11 ENCOUNTER — Ambulatory Visit (INDEPENDENT_AMBULATORY_CARE_PROVIDER_SITE_OTHER): Admitting: Podiatry

## 2023-09-11 DIAGNOSIS — M778 Other enthesopathies, not elsewhere classified: Secondary | ICD-10-CM | POA: Diagnosis not present

## 2023-09-11 MED ORDER — TRIAMCINOLONE ACETONIDE 10 MG/ML IJ SUSP
5.0000 mg | Freq: Once | INTRAMUSCULAR | Status: AC
  Administered 2023-09-11: 5 mg via INTRAMUSCULAR

## 2023-09-11 NOTE — Patient Instructions (Signed)

## 2023-09-11 NOTE — Progress Notes (Signed)
 Subjective: Chief Complaint  Patient presents with   Arthritis    Wants injection     73 year old female presents the office today for evaluation of recurrent pain on the dorsal aspect the left foot, bone spur.  She states the injections are quite helpful and she wants to have another injection today.  No injuries or changes.   Objective: AAO x3, NAD DP/PT pulses palpable bilaterally, CRT less than 3 seconds Dorsal exostosis of the left midfoot.  There is localized edema to the area. Tenderness to palpation directly to this area.  Flexor, extensor tendons intact.  Tenderness palpation sharply along this area.  Clinically the extensor tendons appear to be intact.  MMT 5/5. No pain with calf compression, swelling, warmth, erythema  Assessment: Capsulitis, bone spur left foot  Plan: -All treatment options discussed with the patient including all alternatives, risks, complications.  -We have previously discussed both conservative and surgical options.  She will pursue another injection.  Discussed risks of repeat injection including tendon rupture and she is well aware and wishes to proceed. -Patient is well aware of risks of repeat injection. Skin clean with alcohol and mixture of 1 cc Kenalog  10, 0.5 cc of Marcaine plain, 0.5 cc of lidocaine plain was infiltrated into the area of maximal tenderness along the midfoot without any complications on the midfoot joint.  She tolerated procedure well.  Post procedure instructions discussed.   -Recommend holding off on activity for next couple days gently injection side effects. -Patient encouraged to call the office with any questions, concerns, change in symptoms.   Nicole Medina DPM
